# Patient Record
Sex: Female | Born: 1950 | Race: Black or African American | Hispanic: No | Smoking: Never smoker
Health system: Southern US, Community
[De-identification: ages and names within clinical notes are randomized; demographics above are authoritative.]

## PROBLEM LIST (undated history)

## (undated) DIAGNOSIS — I10 Essential (primary) hypertension: Secondary | ICD-10-CM

## (undated) DIAGNOSIS — E7800 Pure hypercholesterolemia, unspecified: Secondary | ICD-10-CM

## (undated) HISTORY — PX: TUBAL LIGATION: SHX77

---

## 2010-04-28 ENCOUNTER — Emergency Department (HOSPITAL_BASED_OUTPATIENT_CLINIC_OR_DEPARTMENT_OTHER)
Admission: EM | Admit: 2010-04-28 | Discharge: 2010-04-28 | Disposition: A | Discharge status: Home / Self Care | Payer: BC Managed Care – PPO | Attending: Emergency Medicine | Admitting: Emergency Medicine

## 2010-04-28 DIAGNOSIS — E7800 Pure hypercholesterolemia, unspecified: Secondary | ICD-10-CM | POA: Insufficient documentation

## 2010-04-28 DIAGNOSIS — L255 Unspecified contact dermatitis due to plants, except food: Secondary | ICD-10-CM | POA: Insufficient documentation

## 2010-04-28 DIAGNOSIS — T622X1A Toxic effect of other ingested (parts of) plant(s), accidental (unintentional), initial encounter: Secondary | ICD-10-CM | POA: Insufficient documentation

## 2010-04-28 DIAGNOSIS — I10 Essential (primary) hypertension: Secondary | ICD-10-CM | POA: Insufficient documentation

## 2011-10-12 ENCOUNTER — Encounter (HOSPITAL_BASED_OUTPATIENT_CLINIC_OR_DEPARTMENT_OTHER): Payer: Self-pay | Admitting: *Deleted

## 2011-10-12 ENCOUNTER — Emergency Department (HOSPITAL_BASED_OUTPATIENT_CLINIC_OR_DEPARTMENT_OTHER): Payer: No Typology Code available for payment source

## 2011-10-12 ENCOUNTER — Emergency Department (HOSPITAL_BASED_OUTPATIENT_CLINIC_OR_DEPARTMENT_OTHER)
Admission: EM | Admit: 2011-10-12 | Discharge: 2011-10-12 | Disposition: A | Discharge status: Home / Self Care | Payer: No Typology Code available for payment source | Attending: Emergency Medicine | Admitting: Emergency Medicine

## 2011-10-12 DIAGNOSIS — M549 Dorsalgia, unspecified: Secondary | ICD-10-CM | POA: Insufficient documentation

## 2011-10-12 DIAGNOSIS — I10 Essential (primary) hypertension: Secondary | ICD-10-CM | POA: Insufficient documentation

## 2011-10-12 DIAGNOSIS — M542 Cervicalgia: Secondary | ICD-10-CM | POA: Insufficient documentation

## 2011-10-12 DIAGNOSIS — Y92410 Unspecified street and highway as the place of occurrence of the external cause: Secondary | ICD-10-CM | POA: Insufficient documentation

## 2011-10-12 HISTORY — DX: Pure hypercholesterolemia, unspecified: E78.00

## 2011-10-12 HISTORY — DX: Essential (primary) hypertension: I10

## 2011-10-12 MED ORDER — IBUPROFEN 800 MG PO TABS: 800.0000 mg | ORAL_TABLET | Freq: Three times a day (TID) | ORAL | Status: AC
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
## 2011-10-12 NOTE — ED Provider Notes (Signed)
Medical screening examination/treatment/procedure(s) were performed by non-physician practitioner and as supervising physician I was immediately available for consultation/collaboration.   Dione Booze, MD 10/12/11 2312
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
## 2022-07-16 LAB — HEMOGLOBIN A1C: Hemoglobin A1C: 6

## 2022-07-16 LAB — GLUCOSE, POCT (MANUAL RESULT ENTRY): Glucose Fasting, POC: 105 mg/dL — AB (ref 70–99)

## 2022-07-16 NOTE — Progress Notes (Signed)
Pt has PCP and will do follow up on A1C. Education given

## 2022-09-17 LAB — AMB RESULTS CONSOLE CBG: Glucose: 95
